# Patient Record
Sex: Female | Born: 1980
Health system: Southern US, Community
[De-identification: ages and names within clinical notes are randomized; demographics above are authoritative.]

---
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
## 2001-08-29 ENCOUNTER — Ambulatory Visit (HOSPITAL_COMMUNITY): Admission: RE | Admit: 2001-08-29 | Discharge: 2001-08-29 | Payer: Self-pay | Admitting: Obstetrics and Gynecology

## 2001-08-29 ENCOUNTER — Encounter: Payer: Self-pay | Admitting: Obstetrics and Gynecology

## 2004-04-19 ENCOUNTER — Emergency Department (HOSPITAL_COMMUNITY): Admission: EM | Admit: 2004-04-19 | Discharge: 2004-04-19 | Payer: Self-pay | Admitting: Emergency Medicine

## 2004-06-17 ENCOUNTER — Encounter: Admission: RE | Admit: 2004-06-17 | Discharge: 2004-06-17 | Payer: Self-pay | Admitting: Neurology

## 2004-06-22 ENCOUNTER — Encounter: Admission: RE | Admit: 2004-06-22 | Discharge: 2004-06-22 | Payer: Self-pay | Admitting: Neurology

## 2004-08-19 ENCOUNTER — Encounter: Admission: RE | Admit: 2004-08-19 | Discharge: 2004-08-19 | Payer: Self-pay | Admitting: Neurology

## 2004-08-22 ENCOUNTER — Encounter: Admission: RE | Admit: 2004-08-22 | Discharge: 2004-08-22 | Payer: Self-pay | Admitting: Neurology

## 2005-12-28 ENCOUNTER — Inpatient Hospital Stay (HOSPITAL_COMMUNITY): Admission: AD | Admit: 2005-12-28 | Discharge: 2005-12-28 | Payer: Self-pay | Admitting: Obstetrics and Gynecology

## 2006-01-07 ENCOUNTER — Inpatient Hospital Stay (HOSPITAL_COMMUNITY): Admission: AD | Admit: 2006-01-07 | Discharge: 2006-01-07 | Payer: Self-pay | Admitting: Obstetrics and Gynecology

## 2006-01-18 ENCOUNTER — Inpatient Hospital Stay (HOSPITAL_COMMUNITY): Admission: AD | Admit: 2006-01-18 | Discharge: 2006-01-18 | Payer: Self-pay | Admitting: Obstetrics and Gynecology

## 2006-01-19 ENCOUNTER — Inpatient Hospital Stay (HOSPITAL_COMMUNITY): Admission: AD | Admit: 2006-01-19 | Discharge: 2006-01-21 | Payer: Self-pay | Admitting: *Deleted

## 2006-10-27 ENCOUNTER — Emergency Department (HOSPITAL_COMMUNITY): Admission: EM | Admit: 2006-10-27 | Discharge: 2006-10-27 | Payer: Self-pay | Admitting: Family Medicine

## 2007-09-19 IMAGING — US US FETAL BPP W/O NONSTRESS
1 series · 13 of 13 positions shown · non-contrast
Comparison: none

CLINICAL DATA: 40 weeks estimated gestational age with hypertension and headaches for 1 week.  Nonreactive NST.  Assess fetal wellbeing.

[Series 1: us fetal bpp w/o nonstress · 0.49mm/px · 13 of 13 slices shown]
[im 1/13]
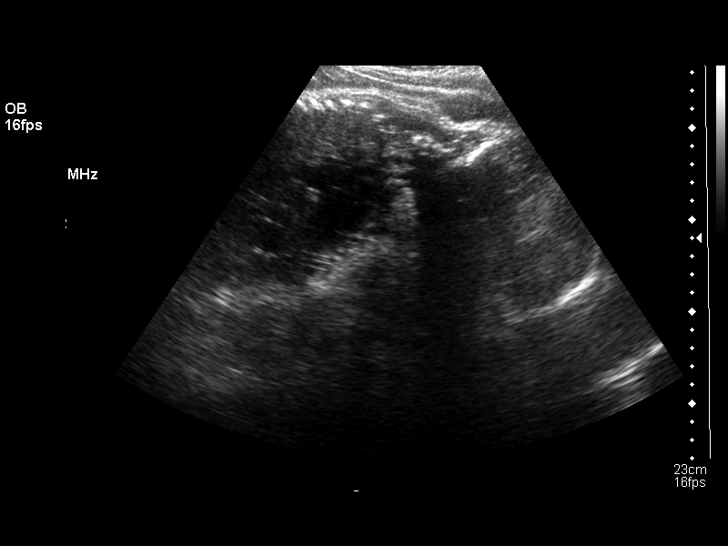
[im 2/13]
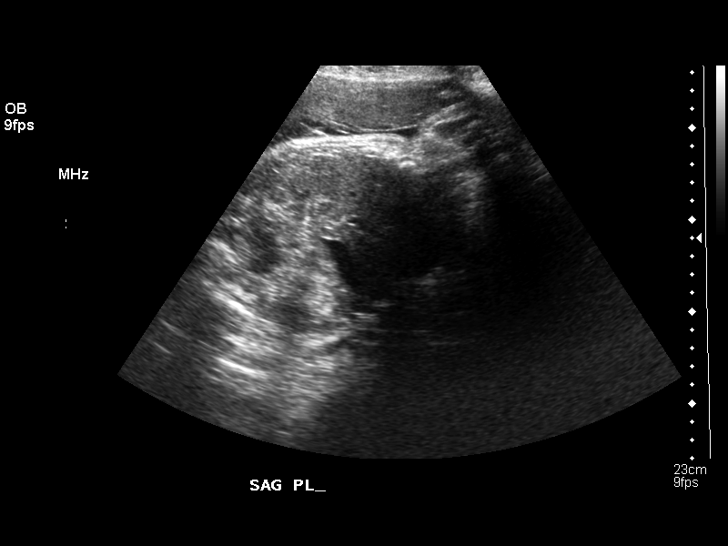
[im 3/13]
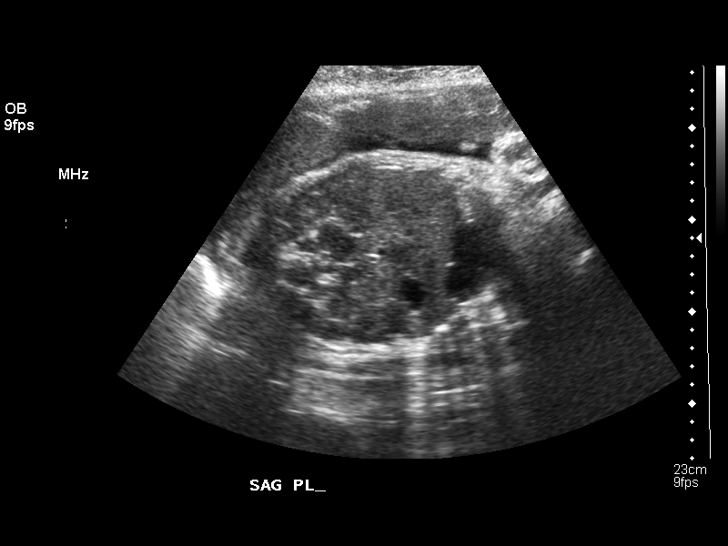
[im 4/13]
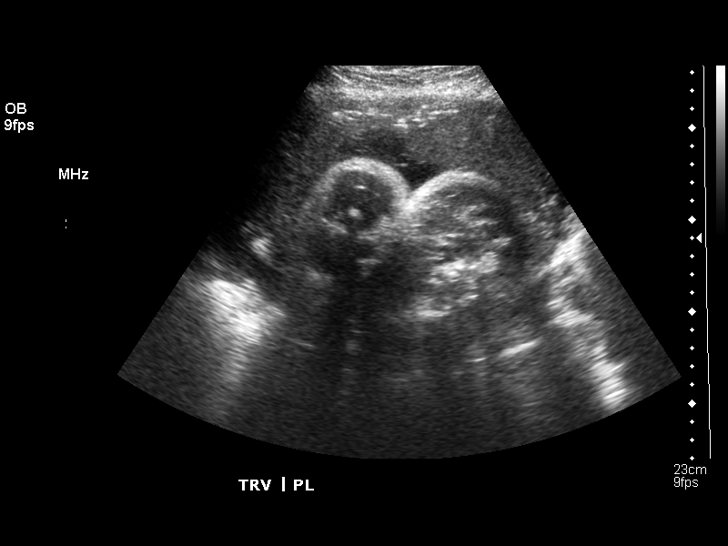
[im 5/13]
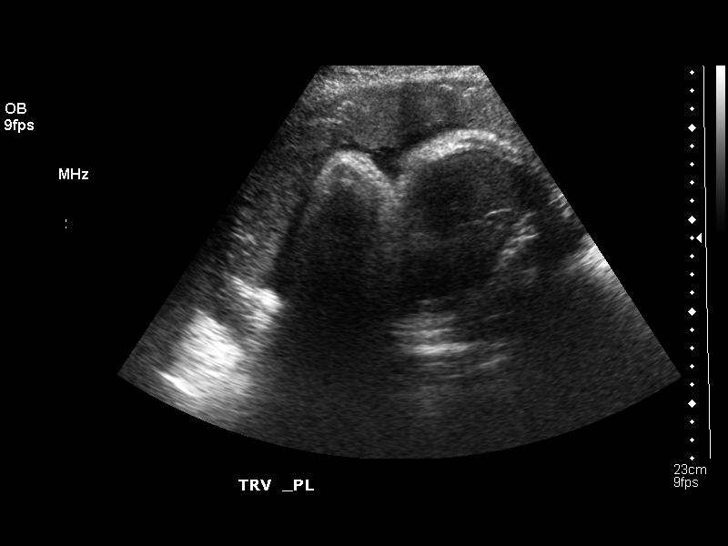
[im 6/13]
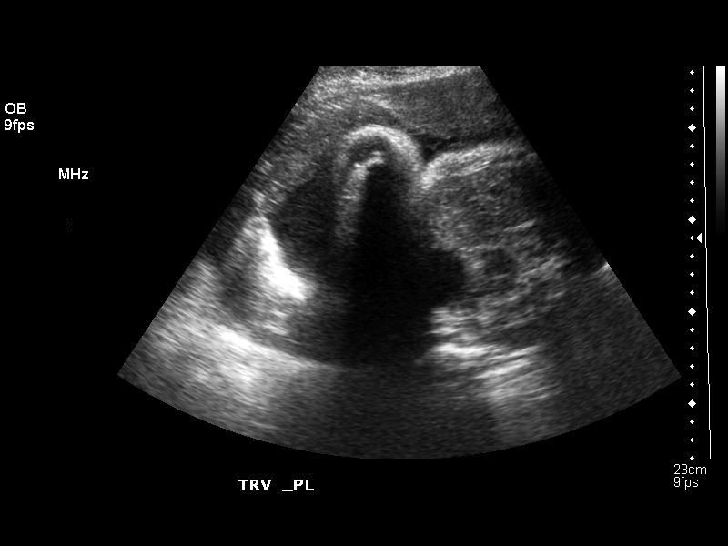
[im 7/13]
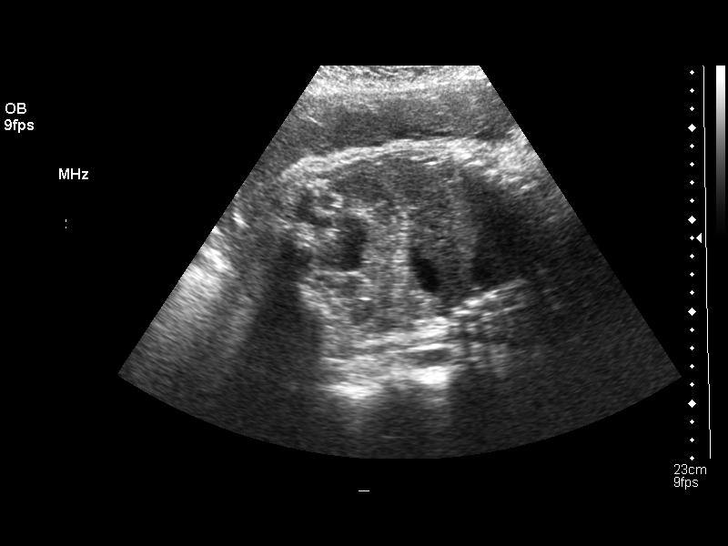
[im 8/13]
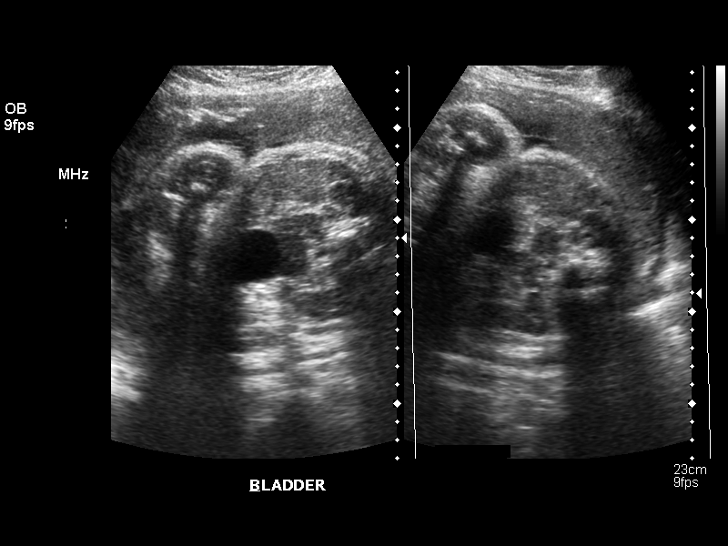
[im 9/13]
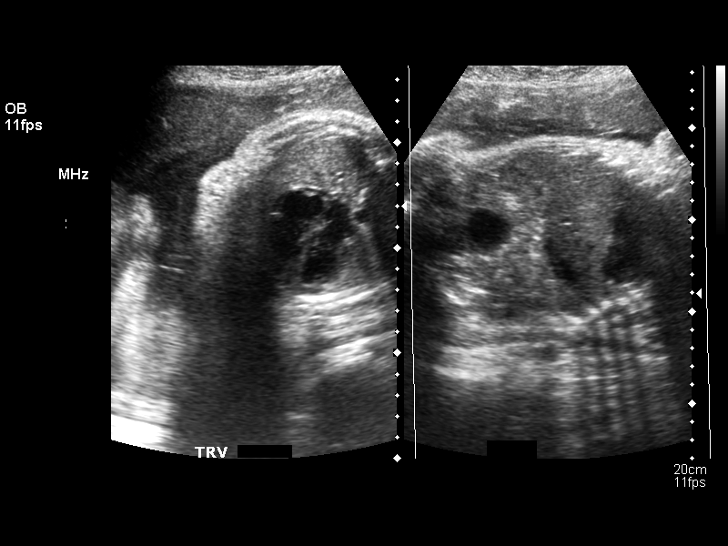
[im 10/13]
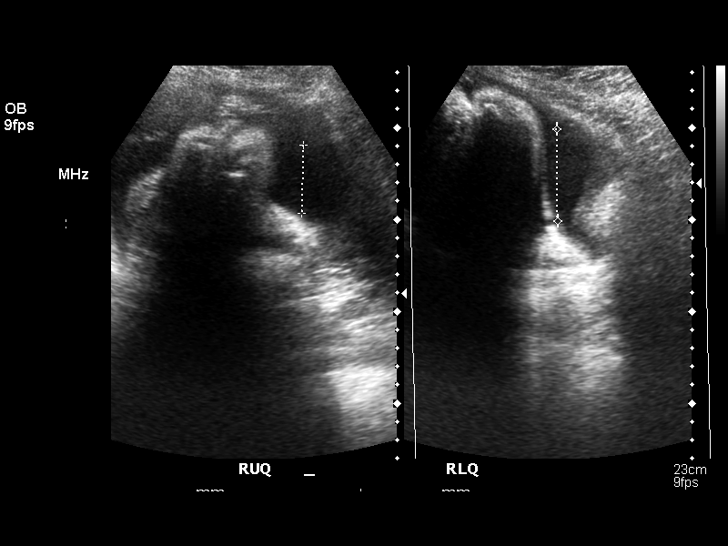
[im 11/13]
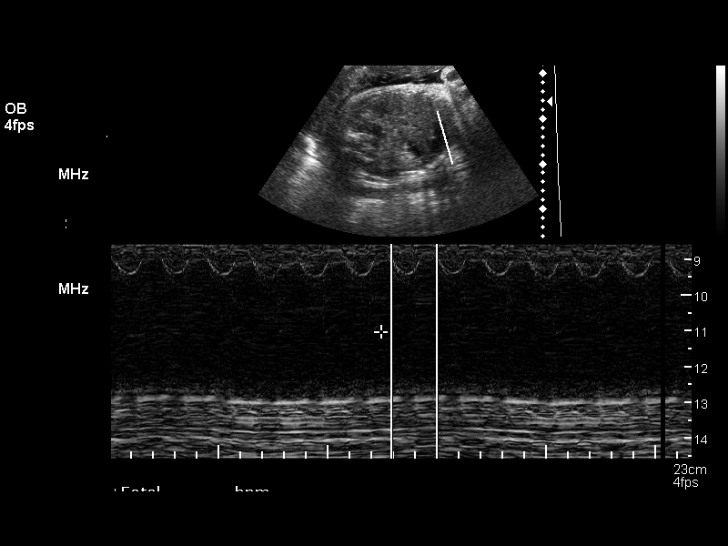
[im 12/13]
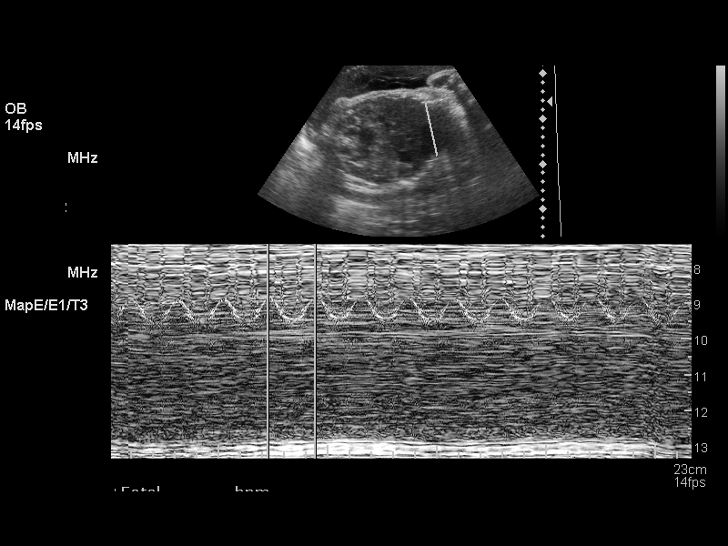
[im 13/13]
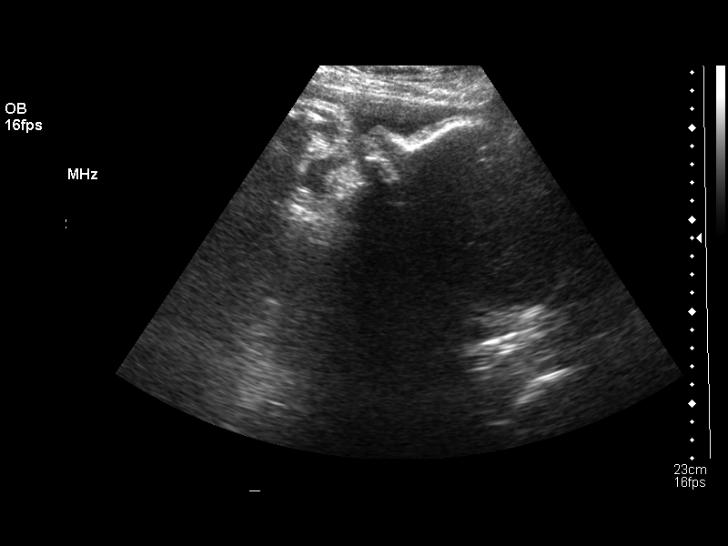

[13 of 13 positions shown; findings below may reference images not displayed]

BIOPHYSICAL PROFILE

 Number of Fetuses:  1
 Heart rate:  147 bpm
 Movement:  Yes
 Breathing:  Yes
 Presentation:  Cephalic
 Placental Location:  Anterior
 Grade:  II
 Previa:  No
 Amniotic Fluid (Subjective):  Normal
 Amniotic Fluid (Objective):  AFI 16.3 cm (5th-85th %ile = 7.1 to 21.4 cm for 40 weeks) 

 Fetal measurements and complete anatomic evaluation were not requested.  The following fetal anatomy was visualized on this exam:   Stomach, kidneys, bladder, and diaphragm.  

 BPP SCORING
 Movements:  2  Time:  30 minutes
 Breathing:  0
 Tone:  2
 Amniotic Fluid:  2
 Total Score:  6

 MATERNAL UTERINE AND ADNEXAL FINDINGS
 Cervix:  Not evaluated.
IMPRESSION: 1.  Biophysical profile score [DATE].  The fetus scored zero for lack of sustained breathing for 20 seconds or more, although fetal breathing was visualized.  
 2.  Subjectively and quantitatively normal amniotic fluid volume.  
 3.  No late developing fetal anatomic abnormalities are seen associated with the stomach, kidneys, or bladder.  A 4-chamber heart view and lateral ventricles could not be seen with clarity due to positioning on today?s exam.

## 2008-06-27 IMAGING — CR DG ANKLE COMPLETE 3+V*R*
3 series · 3 of 3 positions shown · non-contrast
Comparison: none

CLINICAL DATA: Injury.
 RIGHT ANKLE ? 3 VIEW:

[view not recorded (1 of 3)]
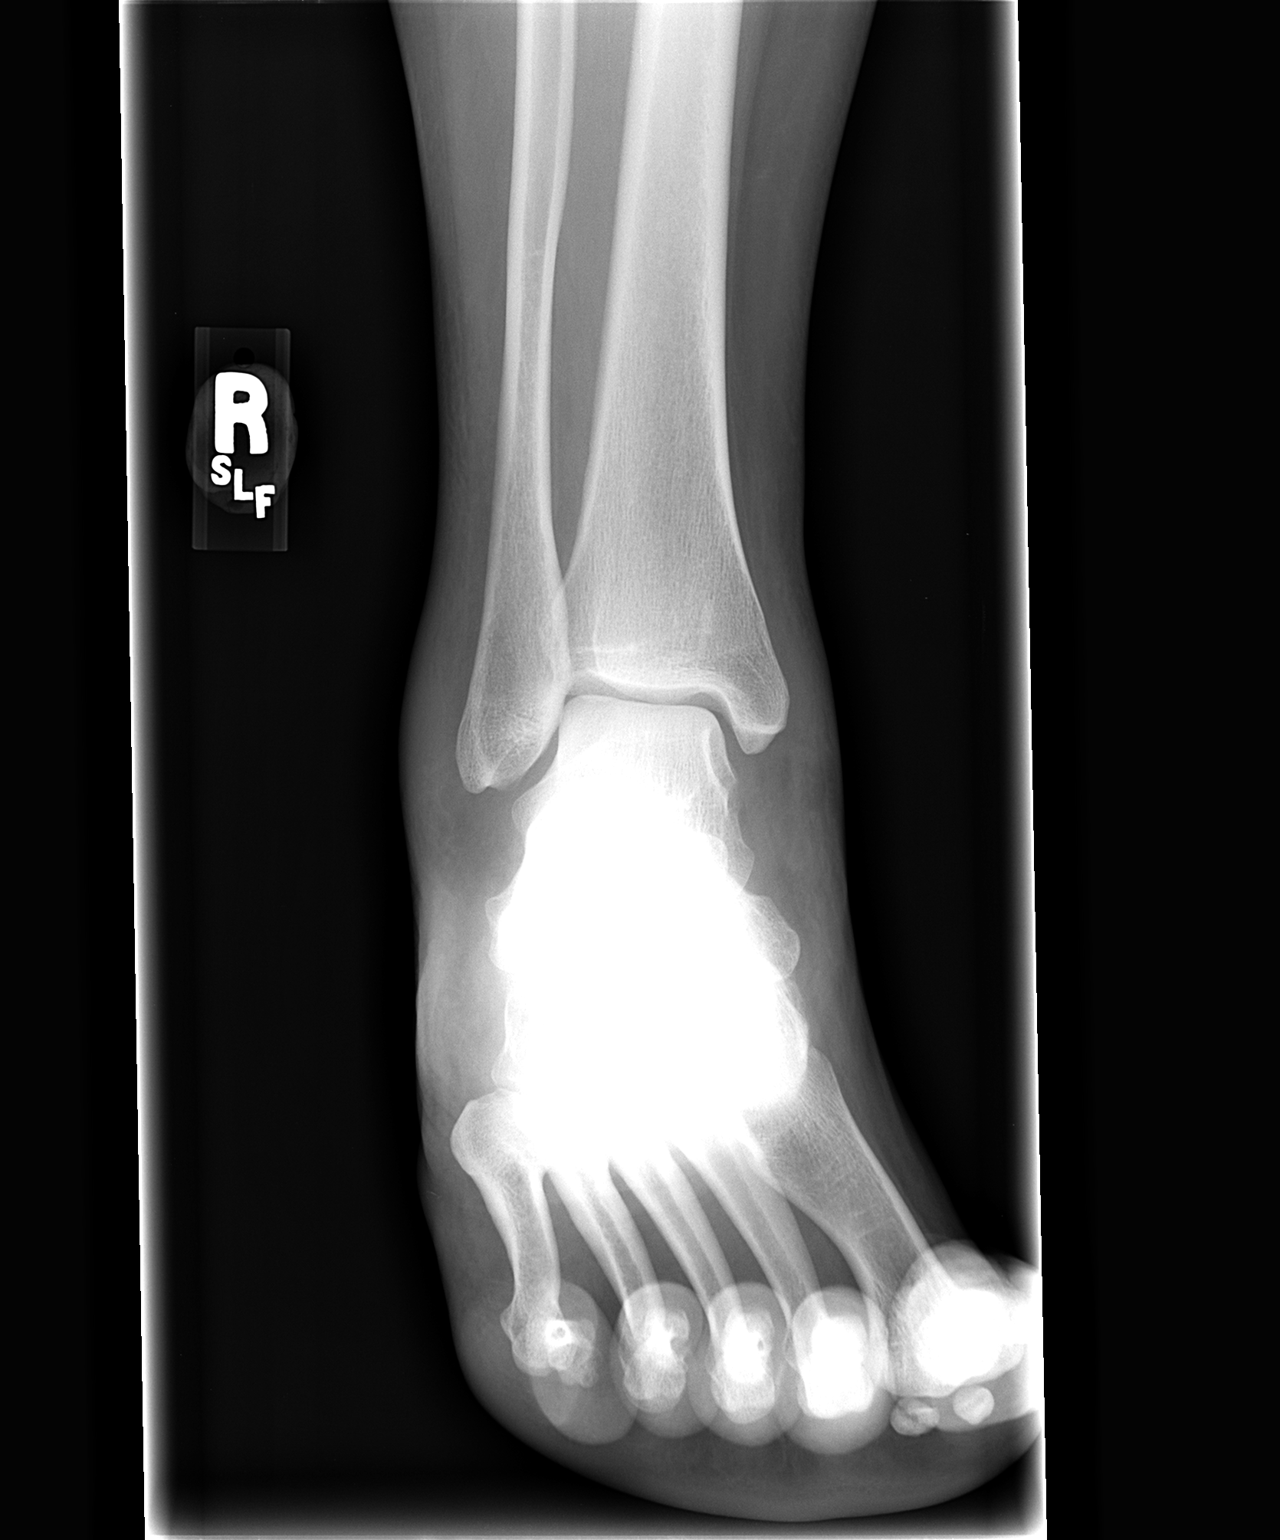

[view not recorded (2 of 3)]
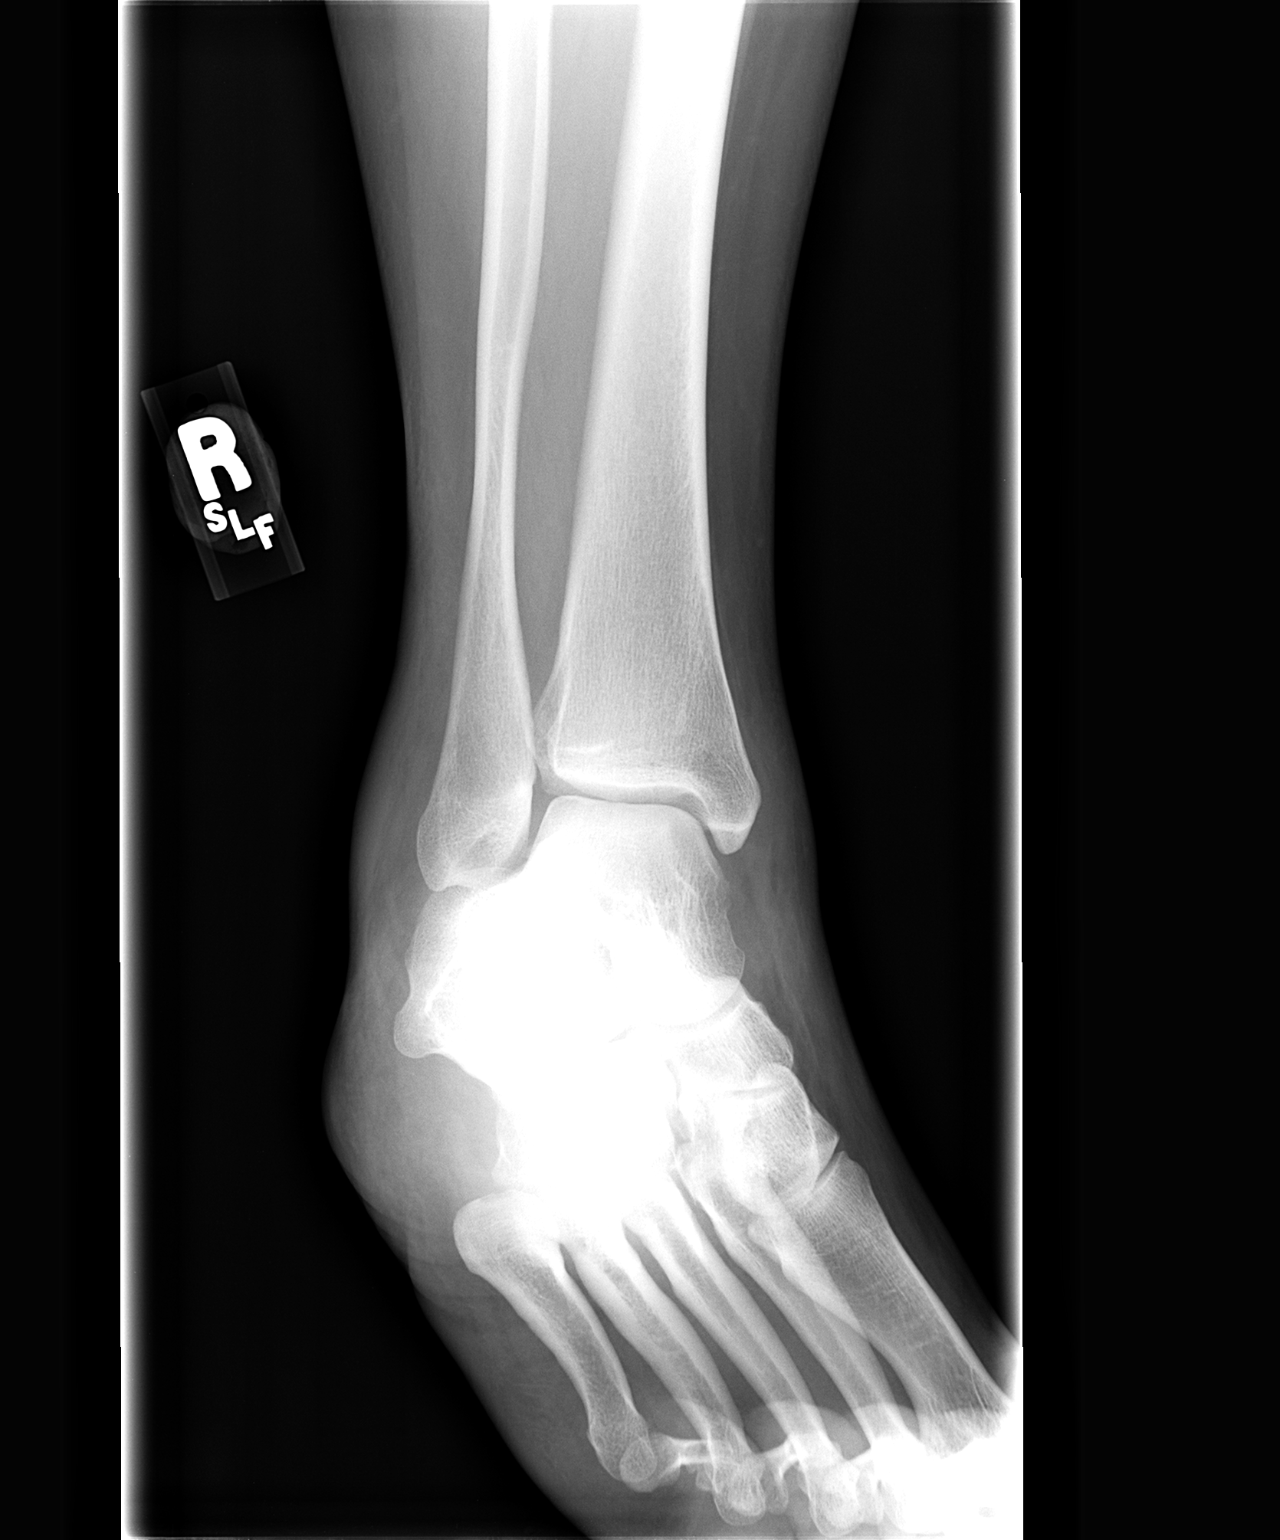

[view not recorded (3 of 3)]
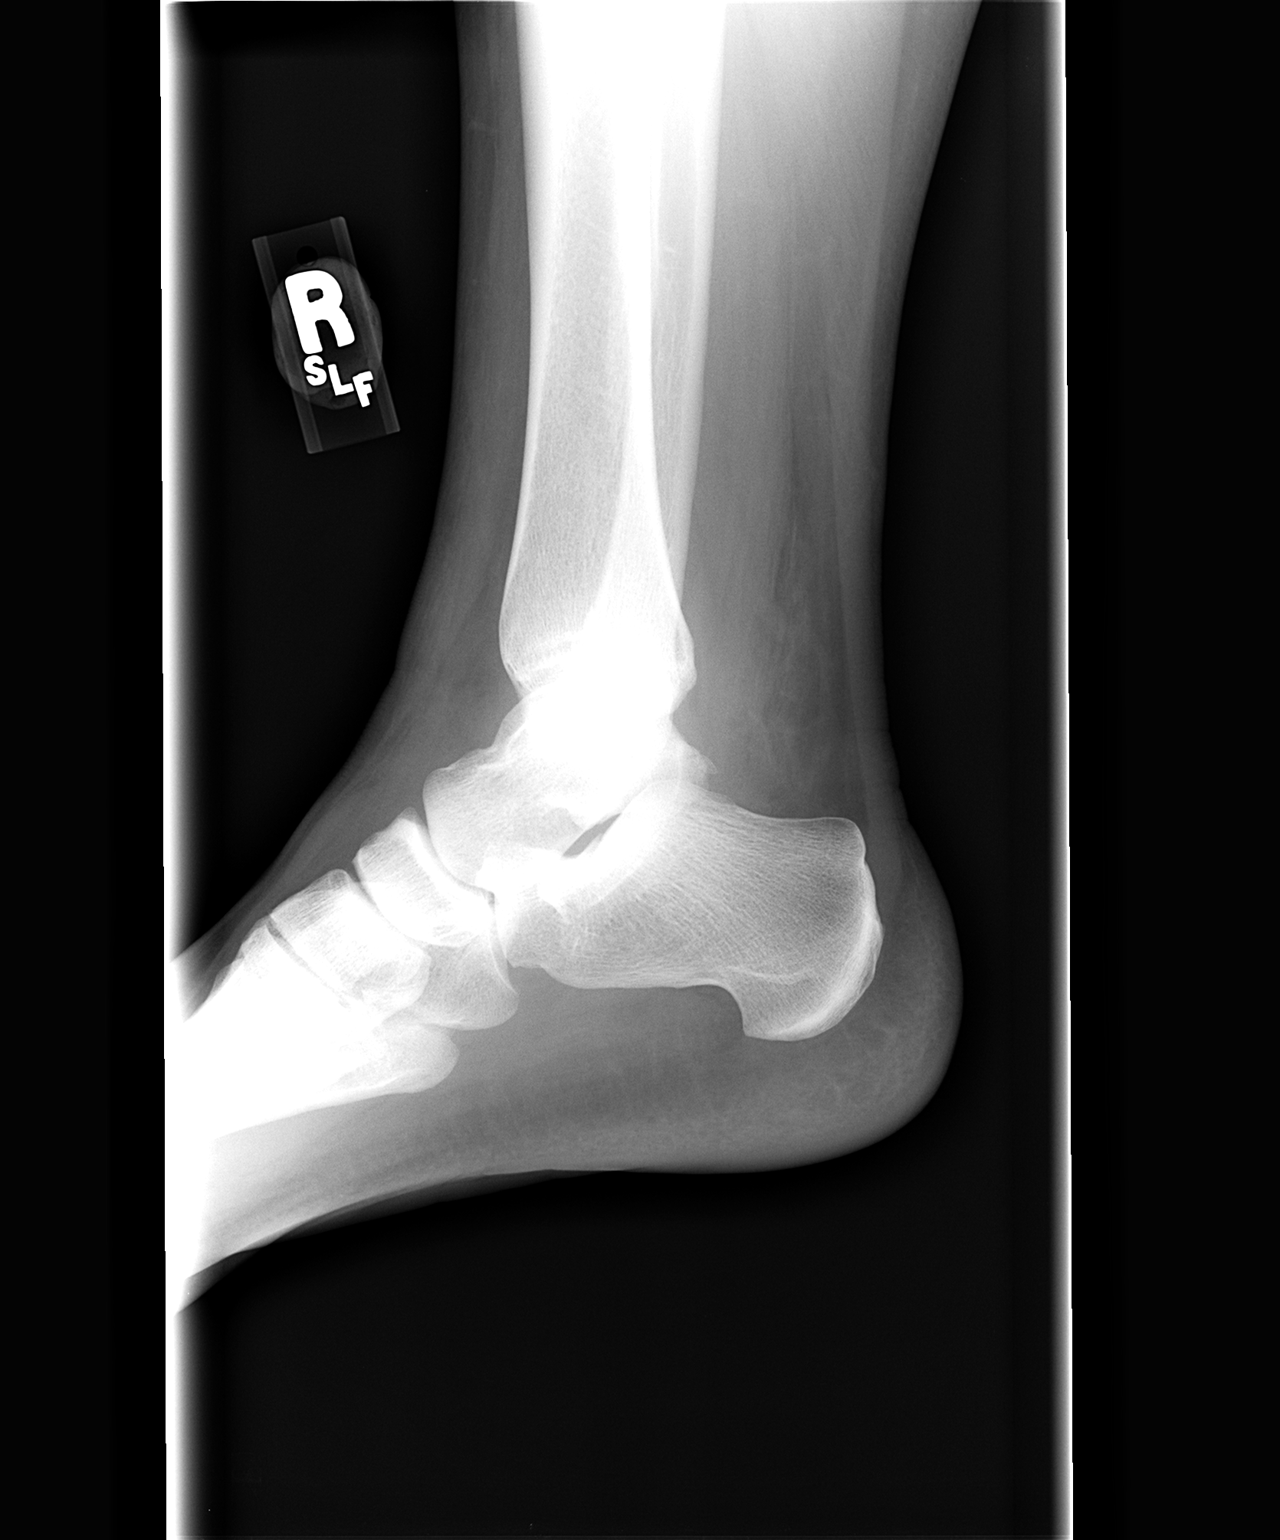

[3 of 3 positions shown; findings below may reference images not displayed]

FINDINGS: Soft tissue swelling about the ankle joint is noted.  There is no underlying fracture or dislocation seen.
IMPRESSION: Soft tissue swelling.  No acute bony injury.

## 2009-07-18 ENCOUNTER — Inpatient Hospital Stay (HOSPITAL_COMMUNITY): Admission: AD | Admit: 2009-07-18 | Discharge: 2009-07-18 | Payer: Self-pay | Admitting: Obstetrics and Gynecology

## 2009-07-18 ENCOUNTER — Ambulatory Visit: Payer: Self-pay | Admitting: Advanced Practice Midwife

## 2009-07-26 ENCOUNTER — Inpatient Hospital Stay (HOSPITAL_COMMUNITY): Admission: AD | Admit: 2009-07-26 | Discharge: 2009-07-28 | Payer: Self-pay | Admitting: Obstetrics and Gynecology

## 2010-06-07 LAB — CBC
HCT: 34.3 % — ABNORMAL LOW (ref 36.0–46.0)
Hemoglobin: 10.4 g/dL — ABNORMAL LOW (ref 12.0–15.0)
MCHC: 34.5 g/dL (ref 30.0–36.0)
Platelets: 202 10*3/uL (ref 150–400)
RBC: 3.99 MIL/uL (ref 3.87–5.11)
RDW: 16.1 % — ABNORMAL HIGH (ref 11.5–15.5)
WBC: 16.5 10*3/uL — ABNORMAL HIGH (ref 4.0–10.5)

## 2010-06-07 LAB — URINE MICROSCOPIC-ADD ON

## 2010-06-07 LAB — URINALYSIS, ROUTINE W REFLEX MICROSCOPIC
Bilirubin Urine: NEGATIVE
Hgb urine dipstick: NEGATIVE
Nitrite: POSITIVE — AB
Protein, ur: NEGATIVE mg/dL
Specific Gravity, Urine: >1.03 — ABNORMAL HIGH (ref 1.005–1.030)
pH: 6 (ref 5.0–8.0)

## 2010-06-07 LAB — URINE CULTURE: Colony Count: >=100000

## 2010-08-05 NOTE — Consult Note (Signed)
NAMELESSA, HUGE NO.:  [id number]   MEDICAL RECORD NO.:  [id number]          PATIENT TYPE:  [facility]   LOCATION:  [facility]                          FACILITY:  [facility]   PHYSICIAN:  Lenoard Aden, M.D.DATE OF BIRTH:  Jun 14, 1980   DATE OF CONSULTATION:  01/07/2006  DATE OF DISCHARGE:                                   CONSULTATION   CHIEF COMPLAINT:  Rule out labor.   HISTORY OF PRESENT ILLNESS:  This is a 30 year old white female, G2, P0,  currently at [redacted] weeks gestation, who presents with complaint of  contractions. She denies bleeding or leakage of fluid.   ALLERGIES:  NO KNOWN DRUG ALLERGIES.   FAMILY HISTORY:  History of myocardial infarction, heart disease.   OB-GYNECOLOGIC HISTORY:  Pregnancy course has been relatively uncomplicated.  She has a history of SAB with no D&C in 2005.   PAST MEDICAL HISTORY:  She has a history of multiple sclerosis in remission,  scoliosis, infertility. She has previously had chicken pox.   SOCIAL HISTORY:  She is a previous smoker, currently reformed.   PHYSICAL EXAMINATION:  VITAL SIGNS:  Stable. Afebrile.  HEENT:  Normal.  NECK:  Supple. Full range of motion.  LUNGS:  Clear.  HEART:  Regular rate and rhythm.  ABDOMEN:  Soft, gravid, and non-tender.  PELVIC:  Cervix 2 cm, 50% vertex, minus 2.  EXTREMITIES:  No cords.  NEUROLOGIC:  Non-focal examination.  SKIN:  Intact.   EMERGENCY DEPARTMENT COURSE:  NST is reactive, irregular. No contractions  are noted. The patient is 38-weeks OB, prodromal labor pattern.   PLAN:  Discharge home. Reassurance given. Fetal activity discussed. Labor  warnings given.     Lenoard Aden, M.D.  Electronically Signed    RJT/MEDQ  D:  01/07/2006  T:  01/07/2006  Job:  [id number]

## 2019-11-14 ENCOUNTER — Ambulatory Visit: Payer: Self-pay | Attending: Internal Medicine

## 2019-11-14 DIAGNOSIS — Z23 Encounter for immunization: Secondary | ICD-10-CM

## 2019-11-14 NOTE — Progress Notes (Signed)
   Covid-19 Vaccination Clinic  Name:  Deanna Olsen    MRN: [id number] DOB: [birth year]  11/14/2019  Ms. Deanna Olsen was observed post Covid-19 immunization for 15 minutes without incident. She was provided with Vaccine Information Sheet and instruction to access the V-Safe system.   Ms. Deanna Olsen was instructed to call 911 with any severe reactions post vaccine: Marland Kitchen Difficulty breathing  . Swelling of face and throat  . A fast heartbeat  . A bad rash all over body  . Dizziness and weakness   Immunizations Administered    Name Date Dose VIS Date Route   JANSSEN COVID-19 VACCINE 11/14/2019 11:52 AM 0.5 mL 05/17/2019 Intramuscular   Manufacturer: [employer]   Lot: [id number]   NDC: 14431-540-08

## 2019-12-25 ENCOUNTER — Other Ambulatory Visit: Payer: Self-pay

## 2019-12-25 ENCOUNTER — Ambulatory Visit
Admission: RE | Admit: 2019-12-25 | Discharge: 2019-12-25 | Disposition: A | Discharge status: Home / Self Care | Payer: Self-pay | Source: Ambulatory Visit | Attending: Emergency Medicine | Admitting: Emergency Medicine

## 2019-12-25 VITALS — BP 108/78 | HR 94 | Temp 97.9°F | Resp 16

## 2019-12-25 DIAGNOSIS — R300 Dysuria: Secondary | ICD-10-CM | POA: Insufficient documentation

## 2019-12-25 DIAGNOSIS — M546 Pain in thoracic spine: Secondary | ICD-10-CM | POA: Insufficient documentation

## 2019-12-25 LAB — POCT URINALYSIS DIP (MANUAL ENTRY)
Bilirubin, UA: NEGATIVE
Glucose, UA: NEGATIVE mg/dL
Ketones, POC UA: NEGATIVE mg/dL
Leukocytes, UA: NEGATIVE
Nitrite, UA: POSITIVE — AB
Protein Ur, POC: NEGATIVE mg/dL
Spec Grav, UA: 1.025 (ref 1.010–1.025)
Urobilinogen, UA: 0.2 E.U./dL
pH, UA: 6 (ref 5.0–8.0)

## 2019-12-25 LAB — POCT URINE PREGNANCY: Preg Test, Ur: NEGATIVE

## 2019-12-25 MED ORDER — NAPROXEN 500 MG PO TABS: 500.0000 mg | ORAL_TABLET | Freq: Two times a day (BID) | ORAL | 0 refills | Status: DC

## 2019-12-25 MED ORDER — CEPHALEXIN 500 MG PO CAPS: 500.0000 mg | ORAL_CAPSULE | Freq: Two times a day (BID) | ORAL | 0 refills | Status: AC

## 2019-12-25 MED ORDER — TIZANIDINE HCL 4 MG PO TABS: 4.0000 mg | ORAL_TABLET | Freq: Four times a day (QID) | ORAL | 0 refills | Status: DC | PRN

## 2019-12-25 NOTE — ED Provider Notes (Signed)
[facility]    CSN: [id number] Arrival date & time: 12/25/19  1054      History   Chief Complaint Chief Complaint  Patient presents with  . Back Pain    middle back radiating around under her breast    HPI Deanna Olsen is a 39 y.o. female presenting today for evaluation of back pain.  Patient reports that since Saturday she has had back pain throughout her bilateral lower ribs wrapping under bilateral breasts.  Denies any injury fall or increase in heavy lifting.  Pain is constant, worse with movement.  Denies any cough shortness of breath.  Denies fevers.  Patient does report tobacco use and reports approximate 1 pack/day.  Denies any significant associated abdominal pain, has had prior cholecystectomy.  Has had decreased appetite due to pain.  She does report some irritation in frequency with urination, but denies any vaginal symptoms of discharge irritation.  Using Tylenol, icy hot and heating pad without significant improvement.  Last menstrual cycle 9/25.  Is not on birth control.   HPI  History reviewed. No pertinent past medical history.  There are no problems to display for this patient.   History reviewed. No pertinent surgical history.  OB History   No obstetric history on file.      Home Medications    Prior to Admission medications   Medication Sig Start Date End Date Taking? Authorizing Provider  cephALEXin (KEFLEX) 500 MG capsule Take 1 capsule (500 mg total) by mouth 2 (two) times daily for 7 days. 12/25/19 01/01/20  Aribelle Mccosh C, PA-C  naproxen (NAPROSYN) 500 MG tablet Take 1 tablet (500 mg total) by mouth 2 (two) times daily. 12/25/19   Adaysha Dubinsky C, PA-C  tiZANidine (ZANAFLEX) 4 MG tablet Take 1 tablet (4 mg total) by mouth every 6 (six) hours as needed for muscle spasms. 12/25/19   Courtlynn Holloman, Junius Creamer, PA-C    Family History History reviewed. No pertinent family history.  Social History Social History   Tobacco Use  . Smoking  status: Never Smoker  . Smokeless tobacco: Never Used  Vaping Use  . Vaping Use: Never used  Substance Use Topics  . Alcohol use: Not Currently  . Drug use: Never     Allergies   Patient has no known allergies.   Review of Systems Review of Systems  Constitutional: Negative for fever.  Respiratory: Negative for shortness of breath.   Cardiovascular: Positive for chest pain.  Gastrointestinal: Negative for abdominal pain, diarrhea, nausea and vomiting.  Genitourinary: Positive for dysuria and frequency. Negative for flank pain, genital sores, hematuria, menstrual problem, vaginal bleeding, vaginal discharge and vaginal pain.  Musculoskeletal: Positive for back pain and myalgias.  Skin: Negative for rash.  Neurological: Negative for dizziness, light-headedness and headaches.     Physical Exam Triage Vital Signs ED Triage Vitals  Enc Vitals Group     BP      Pulse      Resp      Temp      Temp src      SpO2      Weight      Height      Head Circumference      Peak Flow      Pain Score      Pain Loc      Pain Edu?      Excl. in GC?    No data found.  Updated Vital Signs BP 108/78 (BP Location: Left Arm)  Pulse 94   Temp 97.9 F (36.6 C) (Oral)   Resp 16   LMP 12/13/2019 (Approximate)   SpO2 96%   Visual Acuity Right Eye Distance:   Left Eye Distance:   Bilateral Distance:    Right Eye Near:   Left Eye Near:    Bilateral Near:     Physical Exam Vitals and nursing note reviewed.  Constitutional:      Appearance: She is well-developed.     Comments: No acute distress  HENT:     Head: Normocephalic and atraumatic.     Nose: Nose normal.  Eyes:     Conjunctiva/sclera: Conjunctivae normal.  Cardiovascular:     Rate and Rhythm: Normal rate.  Pulmonary:     Effort: Pulmonary effort is normal. No respiratory distress.     Comments: Breathing comfortably at rest, CTABL, no wheezing, rales or other adventitious sounds auscultated Abdominal:      General: There is no distension.     Comments: Mild tenderness to epigastrium and mid lower abdomen, negative rebound, negative Rovsing negative Burney's  Musculoskeletal:        General: Normal range of motion.     Cervical back: Neck supple.     Comments: Lower thoracic spine tender to palpation midline and diffusely throughout bilateral lower thoracic musculature wrapping to flank and anterior lower rib cage underneath bilateral breast  No rash noted  Skin:    General: Skin is warm and dry.  Neurological:     Mental Status: She is alert and oriented to person, place, and time.      UC Treatments / Results  Labs (all labs ordered are listed, but only abnormal results are displayed) Labs Reviewed  POCT URINALYSIS DIP (MANUAL ENTRY) - Abnormal; Notable for the following components:      Result Value   Blood, UA small (*)    Nitrite, UA Positive (*)    All other components within normal limits  URINE CULTURE  POCT URINE PREGNANCY    EKG   Radiology No results found.  Procedures Procedures (including critical care time)  Medications Ordered in UC Medications - No data to display  Initial Impression / Assessment and Plan / UC Course  I have reviewed the triage vital signs and the nursing notes.  Pertinent labs & imaging results that were available during my care of the patient were reviewed by me and considered in my medical decision making (see chart for details).     UA with positive nitrite, sending for culture.  Empirically treating for UTI with Keflex twice daily x1 week.  Push fluids.  Feels back pain most likely correlated with underlying UTI given location in thoracic area.  Treating for MSK etiology, lungs clear, no recent URI symptoms or cough, low suspicion of pneumonia.  Sending home with Naprosyn and muscle relaxers, discussed activity modification. Discussed strict return precautions. Patient verbalized understanding and is agreeable with plan.   Final  Clinical Impressions(s) / UC Diagnoses   Final diagnoses:  Acute bilateral thoracic back pain  Dysuria     Discharge Instructions     Urine culture pending Keflex twice daily for 1 week for UTI Drink plenty of fluids  Naprosyn twice daily with food for back pain Tizanidine as needed to supplement which is a muscle relaxer  Follow up if not improving or worsening    ED Prescriptions    Medication Sig Dispense Auth. Provider   cephALEXin (KEFLEX) 500 MG capsule Take 1 capsule (500 mg  total) by mouth 2 (two) times daily for 7 days. 14 capsule Charnee Turnipseed C, PA-C   naproxen (NAPROSYN) 500 MG tablet Take 1 tablet (500 mg total) by mouth 2 (two) times daily. 30 tablet Paeton Latouche C, PA-C   tiZANidine (ZANAFLEX) 4 MG tablet Take 1 tablet (4 mg total) by mouth every 6 (six) hours as needed for muscle spasms. 30 tablet Leo Weyandt, [place] C, PA-C     PDMP not reviewed this encounter.   Sharita Bienaime, [place] C, PA-C 12/25/19 1204

## 2019-12-25 NOTE — Discharge Instructions (Signed)
Urine culture pending Keflex twice daily for 1 week for UTI Drink plenty of fluids  Naprosyn twice daily with food for back pain Tizanidine as needed to supplement which is a muscle relaxer  Follow up if not improving or worsening

## 2019-12-25 NOTE — ED Triage Notes (Signed)
Pt states she has ben experiencing middle back pain since Monday. Pt states it radiates around under her ribcage bilaterally. Pt states discomfort when urinating. Pt is aox4 and ambulatory.

## 2019-12-27 LAB — URINE CULTURE: Culture: >=100000 — AB

## 2020-03-19 ENCOUNTER — Ambulatory Visit
Admission: EM | Admit: 2020-03-19 | Discharge: 2020-03-19 | Disposition: A | Discharge status: Home / Self Care | Payer: Self-pay | Attending: Internal Medicine | Admitting: Internal Medicine

## 2020-03-19 DIAGNOSIS — J069 Acute upper respiratory infection, unspecified: Secondary | ICD-10-CM

## 2020-03-19 DIAGNOSIS — H6692 Otitis media, unspecified, left ear: Secondary | ICD-10-CM

## 2020-03-19 MED ORDER — AMOXICILLIN 875 MG PO TABS: 875.0000 mg | ORAL_TABLET | Freq: Two times a day (BID) | ORAL | 0 refills | Status: DC

## 2020-03-19 MED ORDER — PSEUDOEPHEDRINE HCL ER 120 MG PO TB12: 120.0000 mg | ORAL_TABLET | Freq: Two times a day (BID) | ORAL | 0 refills | Status: DC

## 2020-03-19 NOTE — ED Provider Notes (Signed)
[facility]    CSN: [id number] Arrival date & time: 03/19/20  0900      History   Chief Complaint Chief Complaint  Patient presents with  . Otalgia    HPI Deanna Olsen Clock is a 39 y.o. female who woke up with nose congestion yesterday am and took some OTC sudafed. As the pm went on her L ear felt fooler and in the middle of the night woke up with L ear pain and felt it was draining. When she cleansed it with Qtip she saw blood. Denies Hx of OM, fever, chills, cough more than her usual smokers cough, or GI symptoms     No past medical history on file.  There are no problems to display for this patient.   No past surgical history on file.  OB History   No obstetric history on file.      Home Medications    Prior to Admission medications   Medication Sig Start Date End Date Taking? Authorizing Provider  naproxen (NAPROSYN) 500 MG tablet Take 1 tablet (500 mg total) by mouth 2 (two) times daily. 12/25/19   Wieters, Hallie C, PA-C  tiZANidine (ZANAFLEX) 4 MG tablet Take 1 tablet (4 mg total) by mouth every 6 (six) hours as needed for muscle spasms. 12/25/19   Wieters, Junius Creamer, PA-C    Family History No family history on file.  Social History Social History   Tobacco Use  . Smoking status: Never Smoker  . Smokeless tobacco: Never Used  Vaping Use  . Vaping Use: Never used  Substance Use Topics  . Alcohol use: Not Currently  . Drug use: Never     Allergies   Patient has no known allergies.   Review of Systems Review of Systems  Constitutional: Negative for chills and fever.  HENT: Positive for ear discharge and ear pain.   Respiratory: Negative for cough.   Gastrointestinal: Negative for diarrhea, nausea and vomiting.  Musculoskeletal: Negative for myalgias.  Neurological: Positive for headaches.     Physical Exam Triage Vital Signs ED Triage Vitals  Enc Vitals Group     BP 03/19/20 1049 114/85     Pulse Rate 03/19/20 1049 80     Resp  03/19/20 1049 16     Temp 03/19/20 1049 (!) 97.4 F (36.3 C)     Temp Source 03/19/20 1049 Temporal     SpO2 03/19/20 1049 96 %     Weight 03/19/20 0958 154 lb (69.9 kg)     Height --      Head Circumference --      Peak Flow --      Pain Score 03/19/20 0958 3     Pain Loc --      Pain Edu? --      Excl. in GC? --    No data found.  Updated Vital Signs BP 114/85 (BP Location: Left Arm)   Pulse 80   Temp (!) 97.4 F (36.3 C) (Temporal)   Resp 16   Wt 154 lb (69.9 kg)   LMP  (Within Weeks) Comment: 3 weeks   SpO2 96%   Visual Acuity Right Eye Distance:   Left Eye Distance:   Bilateral Distance:    Right Eye Near:   Left Eye Near:    Bilateral Near:     Physical Exam Vitals and nursing note reviewed.  Constitutional:      General: She is not in acute distress.    Appearance: She is  not toxic-appearing.  HENT:     Right Ear: External ear normal.     Left Ear: External ear normal.     Ears:     Comments: L TM bulging with vessels very prominent and no perforation noted. There is a little blood in the canal    Nose: Nose normal.     Mouth/Throat:     Pharynx: Oropharynx is clear.  Eyes:     General: No scleral icterus.    Conjunctiva/sclera: Conjunctivae normal.  Pulmonary:     Effort: Pulmonary effort is normal.  Musculoskeletal:     Cervical back: Neck supple.  Skin:    General: Skin is warm and dry.  Neurological:     Mental Status: She is alert and oriented to person, place, and time.     Gait: Gait normal.  Psychiatric:        Mood and Affect: Mood normal.        Behavior: Behavior normal.        Thought Content: Thought content normal.        Judgment: Judgment normal.      UC Treatments / Results  Labs (all labs ordered are listed, but only abnormal results are displayed) Labs Reviewed - No data to display  EKG   Radiology No results found.  Procedures Procedures (including critical care time)  Medications Ordered in UC Medications  - No data to display  Initial Impression / Assessment and Plan / UC Course  I have reviewed the triage vital signs and the nursing notes. Has Uri with L OM. I placed her on Sudafed 12h, and Amoxicillin as noted.  Final Clinical Impressions(s) / UC Diagnoses   Final diagnoses:  None   Discharge Instructions   None    ED Prescriptions    None     PDMP not reviewed this encounter.   Garey Ham, [place] 03/20/20 2339

## 2020-03-19 NOTE — ED Triage Notes (Signed)
Patient presents to Urgent Care with complaints of headache, left ear pain and with scant blood tinged drainage since yesterday. Treating with sudafed with no relief.   Denies fever.

## 2021-03-16 ENCOUNTER — Ambulatory Visit
Admission: EM | Admit: 2021-03-16 | Discharge: 2021-03-16 | Disposition: A | Discharge status: Home / Self Care | Payer: Self-pay | Attending: Physician Assistant | Admitting: Physician Assistant

## 2021-03-16 ENCOUNTER — Other Ambulatory Visit: Payer: Self-pay

## 2021-03-16 ENCOUNTER — Encounter: Payer: Self-pay | Admitting: Emergency Medicine

## 2021-03-16 ENCOUNTER — Ambulatory Visit: Admit: 2021-03-16 | Disposition: A | Discharge status: Home / Self Care | Payer: Self-pay

## 2021-03-16 DIAGNOSIS — R1013 Epigastric pain: Secondary | ICD-10-CM

## 2021-03-16 NOTE — ED Triage Notes (Signed)
Epigastric pain that radiated through to her back starting yesterday. This morning her entire abdomin was very tender. Vomited once yesterday, said it initially made her feel better but that the feelings came back. Reports drinking occasional glasses of wine. Denies regular NSAID use. Says she just recently stopped her period but noticed new pink spotting yesterday, denies possibility of pregnancy.

## 2021-03-16 NOTE — ED Provider Notes (Signed)
[facility]    CSN: [id number] Arrival date & time: 03/16/21  1353      History   Chief Complaint No chief complaint on file.   HPI Deanna Olsen is a 40 y.o. female.   Patient here today for evaluation of epigastric pain that started yesterday.  She reports that pain does radiate through her back at times.  She reports that this morning her entire abdomen was tender to touch.  She did vomit once yesterday.  She has not had any further nausea or vomiting.  She denies any diarrhea.  She has not had any blood in her stool or dark tarry stools.  She has not had fever or chills.  She states symptoms have improved somewhat.  The history is provided by the patient.   Past Medical History:  Diagnosis Date   Multiple sclerosis (HCC)     There are no problems to display for this patient.   Past Surgical History:  Procedure Laterality Date   CHOLECYSTECTOMY      OB History   No obstetric history on file.      Home Medications    Prior to Admission medications   Not on File    Family History History reviewed. No pertinent family history.  Social History Social History   Tobacco Use   Smoking status: Every Day    Types: Cigarettes  Vaping Use   Vaping Use: Never used     Allergies   Patient has no known allergies.   Review of Systems Review of Systems  Constitutional:  Negative for chills and fever.  Eyes:  Negative for discharge and redness.  Respiratory:  Negative for shortness of breath.   Gastrointestinal:  Positive for abdominal pain and vomiting. Negative for diarrhea and nausea.    Physical Exam Triage Vital Signs ED Triage Vitals  Enc Vitals Group     BP 03/16/21 1527 128/77     Pulse Rate 03/16/21 1527 95     Resp 03/16/21 1527 16     Temp 03/16/21 1527 98.2 F (36.8 C)     Temp Source 03/16/21 1527 Oral     SpO2 03/16/21 1527 98 %     Weight --      Height --      Head Circumference --      Peak Flow --      Pain  Score 03/16/21 1531 5     Pain Loc --      Pain Edu? --      Excl. in GC? --    No data found.  Updated Vital Signs BP 128/77 (BP Location: Left Arm)    Pulse 95    Temp 98.2 F (36.8 C) (Oral)    Resp 16    SpO2 98%      Physical Exam Vitals and nursing note reviewed.  Constitutional:      General: She is not in acute distress.    Appearance: Normal appearance. She is not ill-appearing.  HENT:     Head: Normocephalic and atraumatic.     Nose: Nose normal.  Cardiovascular:     Rate and Rhythm: Normal rate and regular rhythm.     Heart sounds: Normal heart sounds. No murmur heard. Pulmonary:     Effort: Pulmonary effort is normal. No respiratory distress.     Breath sounds: Normal breath sounds. No wheezing, rhonchi or rales.  Abdominal:     General: Abdomen is flat. Bowel sounds are normal.  There is no distension.     Palpations: Abdomen is soft.     Tenderness: There is abdominal tenderness (mild diffuse TTP, worse to epigastric area). There is no right CVA tenderness, left CVA tenderness or guarding.  Skin:    General: Skin is warm and dry.  Neurological:     Mental Status: She is alert.  Psychiatric:        Mood and Affect: Mood normal.        Thought Content: Thought content normal.     UC Treatments / Results  Labs (all labs ordered are listed, but only abnormal results are displayed) Labs Reviewed  CBC WITH DIFFERENTIAL/PLATELET  COMPREHENSIVE METABOLIC PANEL  AMYLASE  LIPASE    EKG   Radiology No results found.  Procedures Procedures (including critical care time)  Medications Ordered in UC Medications - No data to display  Initial Impression / Assessment and Plan / UC Course  I have reviewed the triage vital signs and the nursing notes.  Pertinent labs & imaging results that were available during my care of the patient were reviewed by me and considered in my medical decision making (see chart for details).    Labs ordered.  Will await  results further recommendation.  Recommended further evaluation in the emergency department if symptoms worsen in any way.  Final Clinical Impressions(s) / UC Diagnoses   Final diagnoses:  Abdominal pain, epigastric   Discharge Instructions   None    ED Prescriptions   None    PDMP not reviewed this encounter.   Tomi Bamberger, PA-C 03/16/21 [phone]

## 2021-03-17 ENCOUNTER — Encounter (HOSPITAL_COMMUNITY): Payer: Self-pay | Admitting: Emergency Medicine

## 2021-03-17 ENCOUNTER — Emergency Department (HOSPITAL_COMMUNITY)
Admission: EM | Admit: 2021-03-17 | Discharge: 2021-03-17 | Disposition: A | Discharge status: Home / Self Care | Payer: Self-pay | Attending: Emergency Medicine | Admitting: Emergency Medicine

## 2021-03-17 ENCOUNTER — Encounter: Payer: Self-pay | Admitting: Emergency Medicine

## 2021-03-17 ENCOUNTER — Other Ambulatory Visit: Payer: Self-pay

## 2021-03-17 ENCOUNTER — Emergency Department (HOSPITAL_COMMUNITY): Payer: Self-pay

## 2021-03-17 DIAGNOSIS — F17210 Nicotine dependence, cigarettes, uncomplicated: Secondary | ICD-10-CM | POA: Insufficient documentation

## 2021-03-17 DIAGNOSIS — K859 Acute pancreatitis without necrosis or infection, unspecified: Secondary | ICD-10-CM | POA: Insufficient documentation

## 2021-03-17 LAB — CBC WITH DIFFERENTIAL/PLATELET
Abs Immature Granulocytes: 0.06 10*3/uL (ref 0.00–0.07)
Basophils Absolute: 0 10*3/uL (ref 0.0–0.2)
Basophils Absolute: 0 10*3/uL (ref 0.0–0.1)
Basophils Relative: 0 %
Basos: 0 %
EOS (ABSOLUTE): 0.1 10*3/uL (ref 0.0–0.4)
Eos: 1 %
Eosinophils Absolute: 0.1 10*3/uL (ref 0.0–0.5)
Eosinophils Relative: 1 %
HCT: 41 % (ref 36.0–46.0)
Hematocrit: 41.8 % (ref 34.0–46.6)
Hemoglobin: 14.4 g/dL (ref 11.1–15.9)
Hemoglobin: 13.9 g/dL (ref 12.0–15.0)
Immature Grans (Abs): 0 10*3/uL (ref 0.0–0.1)
Immature Granulocytes: 0 %
Immature Granulocytes: 1 %
Lymphocytes Absolute: 2.5 10*3/uL (ref 0.7–3.1)
Lymphocytes Relative: 14 %
Lymphs Abs: 1.9 10*3/uL (ref 0.7–4.0)
Lymphs: 25 %
MCH: 30 pg (ref 26.6–33.0)
MCH: 29.8 pg (ref 26.0–34.0)
MCHC: 34.4 g/dL (ref 31.5–35.7)
MCHC: 33.9 g/dL (ref 30.0–36.0)
MCV: 87 fL (ref 79–97)
MCV: 88 fL (ref 80.0–100.0)
Monocytes Absolute: 0.4 10*3/uL (ref 0.1–0.9)
Monocytes Absolute: 0.8 10*3/uL (ref 0.1–1.0)
Monocytes Relative: 6 %
Monocytes: 4 %
Neutro Abs: 10.4 10*3/uL — ABNORMAL HIGH (ref 1.7–7.7)
Neutrophils Absolute: 6.9 10*3/uL (ref 1.4–7.0)
Neutrophils Relative %: 78 %
Neutrophils: 70 %
Platelets: 219 10*3/uL (ref 150–450)
Platelets: 220 10*3/uL (ref 150–400)
RBC: 4.8 x10E6/uL (ref 3.77–5.28)
RBC: 4.66 MIL/uL (ref 3.87–5.11)
RDW: 12.6 % (ref 11.7–15.4)
RDW: 12.7 % (ref 11.5–15.5)
WBC: 10.1 10*3/uL (ref 3.4–10.8)
WBC: 13.2 10*3/uL — ABNORMAL HIGH (ref 4.0–10.5)
nRBC: 0 % (ref 0.0–0.2)

## 2021-03-17 LAB — COMPREHENSIVE METABOLIC PANEL
ALT: 10 IU/L (ref 0–32)
ALT: 10 U/L (ref 0–44)
AST: 12 IU/L (ref 0–40)
AST: 13 U/L — ABNORMAL LOW (ref 15–41)
Albumin/Globulin Ratio: 1.9 (ref 1.2–2.2)
Albumin: 4.3 g/dL (ref 3.8–4.8)
Albumin: 3.8 g/dL (ref 3.5–5.0)
Alkaline Phosphatase: 67 IU/L (ref 44–121)
Alkaline Phosphatase: 57 U/L (ref 38–126)
Anion gap: 7 (ref 5–15)
BUN/Creatinine Ratio: 9 (ref 9–23)
BUN: 7 mg/dL (ref 6–20)
BUN: 7 mg/dL (ref 6–20)
Bilirubin Total: 0.4 mg/dL (ref 0.0–1.2)
CO2: 23 mmol/L (ref 20–29)
CO2: 25 mmol/L (ref 22–32)
Calcium: 9 mg/dL (ref 8.7–10.2)
Calcium: 8.9 mg/dL (ref 8.9–10.3)
Chloride: 102 mmol/L (ref 96–106)
Chloride: 105 mmol/L (ref 98–111)
Creatinine, Ser: 0.74 mg/dL (ref 0.57–1.00)
Creatinine, Ser: 0.71 mg/dL (ref 0.44–1.00)
GFR, Estimated: >60 mL/min (ref >60)
Globulin, Total: 2.3 g/dL (ref 1.5–4.5)
Glucose, Bld: 114 mg/dL — ABNORMAL HIGH (ref 70–99)
Glucose: 82 mg/dL (ref 70–99)
Potassium: 4.2 mmol/L (ref 3.5–5.2)
Potassium: 3.7 mmol/L (ref 3.5–5.1)
Sodium: 137 mmol/L (ref 134–144)
Sodium: 137 mmol/L (ref 135–145)
Total Bilirubin: 0.5 mg/dL (ref 0.3–1.2)
Total Protein: 6.6 g/dL (ref 6.0–8.5)
Total Protein: 6.8 g/dL (ref 6.5–8.1)
eGFR: 105 mL/min/{1.73_m2} (ref >59)

## 2021-03-17 LAB — URINALYSIS, ROUTINE W REFLEX MICROSCOPIC
Bilirubin Urine: NEGATIVE
Glucose, UA: NEGATIVE mg/dL
Ketones, ur: 20 mg/dL — AB
Nitrite: POSITIVE — AB
Protein, ur: 30 mg/dL — AB
Specific Gravity, Urine: 1.028 (ref 1.005–1.030)
WBC, UA: >50 WBC/hpf — ABNORMAL HIGH (ref 0–5)
pH: 5 (ref 5.0–8.0)

## 2021-03-17 LAB — PREGNANCY, URINE: Preg Test, Ur: NEGATIVE

## 2021-03-17 LAB — AMYLASE
Amylase: 689 U/L (ref 31–110)
Amylase: 208 U/L — ABNORMAL HIGH (ref 28–100)

## 2021-03-17 LAB — LIPASE, BLOOD: Lipase: 90 U/L — ABNORMAL HIGH (ref 11–51)

## 2021-03-17 MED ORDER — HYDROMORPHONE HCL 1 MG/ML IJ SOLN
1.0000 mg | Freq: Once | INTRAMUSCULAR | Status: AC
  Administered 2021-03-17: 18:00:00 1 mg via INTRAVENOUS
  Filled 2021-03-17: qty 1

## 2021-03-17 MED ORDER — IOHEXOL 350 MG/ML SOLN
80.0000 mL | Freq: Once | INTRAVENOUS | Status: AC | PRN
  Administered 2021-03-17: 16:00:00 80 mL via INTRAVENOUS

## 2021-03-17 MED ORDER — LACTATED RINGERS IV BOLUS
1000.0000 mL | Freq: Once | INTRAVENOUS | Status: AC
  Administered 2021-03-17: 18:00:00 1000 mL via INTRAVENOUS

## 2021-03-17 MED ORDER — HYDROCODONE-ACETAMINOPHEN 5-325 MG PO TABS
1.0000 | ORAL_TABLET | Freq: Once | ORAL | Status: AC
  Administered 2021-03-17: 22:00:00 1 via ORAL
  Filled 2021-03-17: qty 1

## 2021-03-17 MED ORDER — HYDROCODONE-ACETAMINOPHEN 5-325 MG PO TABS: 1.0000 | ORAL_TABLET | ORAL | 0 refills | Status: DC | PRN

## 2021-03-17 NOTE — ED Notes (Signed)
Patient tolerated oral intake well 

## 2021-03-17 NOTE — ED Triage Notes (Signed)
Pt here POV with c/o abdominal pain. Labs results amylase 664. Pain 9/10 . History of MS

## 2021-03-17 NOTE — Discharge Instructions (Addendum)
Use a liquid diet until you are starting to feel better from an abdominal pain perspective.  Avoid greasy and fatty foods.  Follow-up with the gastroenterology office listed.  If at any point you develop fever, new or worsening or uncontrolled abdominal pain, vomiting, or any other new/concerning symptoms then return to the ER for evaluation.

## 2021-03-17 NOTE — ED Provider Notes (Signed)
[facility] EMERGENCY DEPARTMENT Provider Note   CSN: [id number] Arrival date & time: 03/17/21  1343     History Chief Complaint  Patient presents with   Abdominal Pain    Deanna Olsen is a 40 y.o. female.  HPI 40 year old female presents with upper abdominal pain. Started 2 days ago all of a sudden.  She has never had this before.  At times the pain is severe but currently is about a 6 out of 10.  She took Pepto-Bismol, which urgent care recommended yesterday but it did not help.  She otherwise was called by urgent care and told to go to the ER because they checked an amylase yesterday and it came back elevated at around 600.  The pain is across her upper abdomen and also hurting in her back.  She vomited once and has had a little bit of diarrhea.  She has never had pancreas problems before.  She drinks alcohol occasionally but not significantly her every day.  She had her gallbladder removed previously.  No new medicines.  Past Medical History:  Diagnosis Date   Multiple sclerosis (HCC)     There are no problems to display for this patient.   Past Surgical History:  Procedure Laterality Date   CHOLECYSTECTOMY       OB History   No obstetric history on file.     No family history on file.  Social History   Tobacco Use   Smoking status: Every Day    Types: Cigarettes   Smokeless tobacco: Never  Vaping Use   Vaping Use: Never used  Substance Use Topics   Alcohol use: Not Currently   Drug use: Never    Home Medications Prior to Admission medications   Medication Sig Start Date End Date Taking? Authorizing Provider  HYDROcodone-acetaminophen (NORCO) 5-325 MG tablet Take 1 tablet by mouth every 4 (four) hours as needed for severe pain. 03/17/21  Yes Pricilla Loveless, MD    Allergies    Patient has no known allergies.  Review of Systems   Review of Systems  Constitutional:  Negative for fever.  Gastrointestinal:  Positive for  abdominal pain, diarrhea and vomiting.  Musculoskeletal:  Positive for back pain.  All other systems reviewed and are negative.  Physical Exam Updated Vital Signs BP 119/83 (BP Location: Right Arm)    Pulse 90    Temp 98.3 F (36.8 C) (Oral)    Resp 20    Ht 5\' 7"  (1.702 m)    Wt 72.6 kg    SpO2 100%    BMI 25.06 kg/m   Physical Exam Vitals and nursing note reviewed.  Constitutional:      General: She is not in acute distress.    Appearance: She is well-developed. She is not ill-appearing or diaphoretic.  HENT:     Head: Normocephalic and atraumatic.     Right Ear: External ear normal.     Left Ear: External ear normal.     Nose: Nose normal.  Eyes:     General:        Right eye: No discharge.        Left eye: No discharge.  Cardiovascular:     Rate and Rhythm: Regular rhythm. Tachycardia present.     Heart sounds: Normal heart sounds.  Pulmonary:     Effort: Pulmonary effort is normal.     Breath sounds: Normal breath sounds.  Abdominal:     Palpations: Abdomen is  soft.     Tenderness: There is generalized abdominal tenderness (tender diffusely, though most prominent in LUQ) and tenderness in the left upper quadrant.  Skin:    General: Skin is warm and dry.  Neurological:     Mental Status: She is alert.  Psychiatric:        Mood and Affect: Mood is not anxious.    ED Results / Procedures / Treatments   Labs (all labs ordered are listed, but only abnormal results are displayed) Labs Reviewed  CBC WITH DIFFERENTIAL/PLATELET - Abnormal; Notable for the following components:      Result Value   WBC 13.2 (*)    Neutro Abs 10.4 (*)    All other components within normal limits  COMPREHENSIVE METABOLIC PANEL - Abnormal; Notable for the following components:   Glucose, Bld 114 (*)    AST 13 (*)    All other components within normal limits  LIPASE, BLOOD - Abnormal; Notable for the following components:   Lipase 90 (*)    All other components within normal limits   URINALYSIS, ROUTINE W REFLEX MICROSCOPIC - Abnormal; Notable for the following components:   Color, Urine AMBER (*)    APPearance CLOUDY (*)    Hgb urine dipstick MODERATE (*)    Ketones, ur 20 (*)    Protein, ur 30 (*)    Nitrite POSITIVE (*)    Leukocytes,Ua LARGE (*)    WBC, UA >50 (*)    Bacteria, UA MANY (*)    All other components within normal limits  AMYLASE - Abnormal; Notable for the following components:   Amylase 208 (*)    All other components within normal limits  PREGNANCY, URINE    EKG None  Radiology CT ABDOMEN PELVIS W CONTRAST  Result Date: 03/17/2021 CLINICAL DATA:  Abdominal pain.  Abnormal mL lace. EXAM: CT ABDOMEN AND PELVIS WITH CONTRAST TECHNIQUE: Multidetector CT imaging of the abdomen and pelvis was performed using the standard protocol following bolus administration of intravenous contrast. CONTRAST:  54mL OMNIPAQUE IOHEXOL 350 MG/ML SOLN COMPARISON:  None. FINDINGS: Lower chest: The lung bases are clear without focal nodule, mass, or airspace disease. The heart size is normal. No significant pleural or pericardial effusion is present. Hepatobiliary: Mild fatty infiltration of the liver is present. Patient is status post cholecystectomy. Common bile duct is within normal limits. Pancreas: Minimal stranding is present along the inferior margin of the pancreas near the tail. Normal enhancement is present. No significant cysts are present. Spleen: Normal in size without focal abnormality. Adrenals/Urinary Tract: Adrenal glands are unremarkable. Kidneys are normal, without renal calculi, focal lesion, or hydronephrosis. Bladder is unremarkable. Stomach/Bowel: The stomach is within normal limits. Duodenal diverticulum is present without associated inflammation. Duodenum is otherwise within normal limits. Small bowel is within normal limits. The terminal ileum is within normal limits. Appendix is visualized and normal. The ascending and transverse colon are within  normal limits. Wall thickening and inflammatory changes are present in the proximal descending colon. There is some wall thickening in the sigmoid colon without associated pericolonic inflammation. Vascular/Lymphatic: No significant vascular findings are present. No enlarged abdominal or pelvic lymph nodes. Reproductive: Uterus and bilateral adnexa are unremarkable. Other: Small amount of free fluid is noted along the left pericolic gutter and anatomic pelvis. No enhancing collection or evidence for abscess. Musculoskeletal: Vertebral body heights and alignment are normal. Chronic pelvis normal. No focal lesions are present. IMPRESSION: 1. Inflammatory changes about the distal pancreas compatible with acute pancreatitis.  No complicating factors. 2. Wall thickening and inflammatory changes in the proximal descending colon is likely reactive. 3. Small amount of free fluid along the left pericolic gutter and anatomic pelvis without evidence for abscess. 4. Mild fatty infiltration of the liver. 5. Cholecystectomy. Electronically Signed   By: Marin Roberts M.D.   On: 03/17/2021 15:58    Procedures Procedures   Medications Ordered in ED Medications  iohexol (OMNIPAQUE) 350 MG/ML injection 80 mL (80 mLs Intravenous Contrast Given 03/17/21 1546)  lactated ringers bolus 1,000 mL (1,000 mLs Intravenous New Bag/Given 03/17/21 1824)  lactated ringers bolus 1,000 mL (1,000 mLs Intravenous New Bag/Given 03/17/21 1823)  HYDROmorphone (DILAUDID) injection 1 mg (1 mg Intravenous Given 03/17/21 1824)  HYDROcodone-acetaminophen (NORCO/VICODIN) 5-325 MG per tablet 1 tablet (1 tablet Oral Given 03/17/21 2159)    ED Course  I have reviewed the triage vital signs and the nursing notes.  Pertinent labs & imaging results that were available during my care of the patient were reviewed by me and considered in my medical decision making (see chart for details).    MDM Rules/Calculators/A&P                          Patient initially was pretty tachycardic though she states she was also anxious because someone told her she would need to have her pancreas surgically removed.  Now, she is feeling much better.  At first she did not want any pain meds but then did ask for some after pain got worse.  She has tolerated oral meds well.  She has a mild leukocytosis and mild lipase elevation.  CT is consistent with pancreatitis without obvious acute cause.  At this point, after discussion with patient she feels well enough to go home which I think is reasonable but does not have a PCP so will need outpatient work-up for pancreatitis.  Will refer to gastroenterology.  She otherwise was given return precautions will be given a short course of Norco at home.  Instructed to do liquid diet until improved from an abdominal pain perspective.    Final Clinical Impression(s) / ED Diagnoses Final diagnoses:  Acute pancreatitis without infection or necrosis, unspecified pancreatitis type    Rx / DC Orders ED Discharge Orders          Ordered    Ambulatory referral to Gastroenterology        03/17/21 2047    HYDROcodone-acetaminophen (NORCO) 5-325 MG tablet  Every 4 hours PRN        03/17/21 2119             Pricilla Loveless, MD 03/17/21 2230

## 2021-03-17 NOTE — ED Provider Notes (Signed)
Emergency Medicine Provider Triage Evaluation Note  Deanna Olsen , a 40 y.o. female  was evaluated in triage.  Pt complains of gradual onset, 9/10, epigastric pain onset 2 days.  Patient was evaluated at her primary care office who obtained labs and had an elevated amylase at 664.  Patient denies NSAID, alcohol use, diabetes.  Has associated resolved nonbloody vomiting.  Has not tried medications for her symptoms denies fever, chills, chest pain, shortness of breath.    Review of Systems  Positive: Epigastric abdominal pain, resolved vomiting Negative: Fever, chills  Physical Exam  BP 104/76 (BP Location: Left Arm)    Pulse (!) 124    Temp 98.1 F (36.7 C) (Oral)    Resp 20    Ht 5\' 7"  (1.702 m)    Wt 72.6 kg    SpO2 100%    BMI 25.06 kg/m  Gen:   Awake, no distress   Resp:  Normal effort  MSK:   Moves extremities without difficulty  Other:  Tenderness to palpation to upper abdomen.  No spinal tenderness to palpation.  No overlying skin changes to back or abdomen.  Medical Decision Making  Medically screening exam initiated at 2:41 PM.  Appropriate orders placed.  Carrie Finn Altemose was informed that the remainder of the evaluation will be completed by another provider, this initial triage assessment does not replace that evaluation, and the importance of remaining in the ED until their evaluation is complete.  2:49 PM - Discussed with RN that patient is in need of a room immediately. RN aware and working on room placement.     Paytan Recine A, PA-C 03/17/21 1450    03/19/21, MD 03/17/21 1606

## 2021-06-28 ENCOUNTER — Encounter: Payer: Self-pay | Admitting: Emergency Medicine

## 2021-07-04 ENCOUNTER — Encounter: Payer: Self-pay | Admitting: Gastroenterology

## 2021-07-27 ENCOUNTER — Other Ambulatory Visit (INDEPENDENT_AMBULATORY_CARE_PROVIDER_SITE_OTHER): Payer: Self-pay

## 2021-07-27 ENCOUNTER — Ambulatory Visit (INDEPENDENT_AMBULATORY_CARE_PROVIDER_SITE_OTHER): Payer: Self-pay | Admitting: Gastroenterology

## 2021-07-27 ENCOUNTER — Encounter: Payer: Self-pay | Admitting: Gastroenterology

## 2021-07-27 VITALS — BP 126/74 | HR 80 | Ht 67.0 in | Wt 165.0 lb

## 2021-07-27 DIAGNOSIS — R9389 Abnormal findings on diagnostic imaging of other specified body structures: Secondary | ICD-10-CM

## 2021-07-27 DIAGNOSIS — K858 Other acute pancreatitis without necrosis or infection: Secondary | ICD-10-CM

## 2021-07-27 LAB — CBC WITH DIFFERENTIAL/PLATELET
Basophils Absolute: 0.1 10*3/uL (ref 0.0–0.1)
Basophils Relative: 0.8 % (ref 0.0–3.0)
Eosinophils Absolute: 0.1 10*3/uL (ref 0.0–0.7)
Eosinophils Relative: 1.3 % (ref 0.0–5.0)
HCT: 41.7 % (ref 36.0–46.0)
Hemoglobin: 14 g/dL (ref 12.0–15.0)
Lymphocytes Relative: 39.2 % (ref 12.0–46.0)
Lymphs Abs: 2.7 10*3/uL (ref 0.7–4.0)
MCHC: 33.6 g/dL (ref 30.0–36.0)
MCV: 90.2 fl (ref 78.0–100.0)
Monocytes Absolute: 0.4 10*3/uL (ref 0.1–1.0)
Monocytes Relative: 5.6 % (ref 3.0–12.0)
Neutro Abs: 3.6 10*3/uL (ref 1.4–7.7)
Neutrophils Relative %: 53.1 % (ref 43.0–77.0)
Platelets: 191 10*3/uL (ref 150.0–400.0)
RBC: 4.62 Mil/uL (ref 3.87–5.11)
RDW: 13.2 % (ref 11.5–15.5)
WBC: 6.8 10*3/uL (ref 4.0–10.5)

## 2021-07-27 LAB — COMPREHENSIVE METABOLIC PANEL
ALT: 8 U/L (ref 0–35)
AST: 10 U/L (ref 0–37)
Albumin: 4.4 g/dL (ref 3.5–5.2)
Alkaline Phosphatase: 63 U/L (ref 39–117)
BUN: 8 mg/dL (ref 6–23)
CO2: 26 mEq/L (ref 19–32)
Calcium: 9.4 mg/dL (ref 8.4–10.5)
Chloride: 105 mEq/L (ref 96–112)
Creatinine, Ser: 0.71 mg/dL (ref 0.40–1.20)
GFR: 106.64 mL/min (ref >60.00)
Glucose, Bld: 94 mg/dL (ref 70–99)
Potassium: 4.9 mEq/L (ref 3.5–5.1)
Sodium: 139 mEq/L (ref 135–145)
Total Bilirubin: 0.4 mg/dL (ref 0.2–1.2)
Total Protein: 7.3 g/dL (ref 6.0–8.3)

## 2021-07-27 LAB — LIPASE: Lipase: 23 U/L (ref 11.0–59.0)

## 2021-07-27 NOTE — Patient Instructions (Signed)
Your provider has requested that you go to the basement level for lab work before leaving today. Press "B" on the elevator. The lab is located at the first door on the left as you exit the elevator.  ? ?If you are age 40 or older, your body mass index should be between 23-30. Your Body mass index is 25.84 kg/m?Marland Kitchen If this is out of the aforementioned range listed, please consider follow up with your Primary Care Provider. ? ?If you are age 43 or younger, your body mass index should be between 19-25. Your Body mass index is 25.84 kg/m?Marland Kitchen If this is out of the aformentioned range listed, please consider follow up with your Primary Care Provider.  ? ?________________________________________________________ ? ?The [facility] providers would like to encourage you to use [facility] to communicate with providers for non-urgent requests or questions.  Due to long hold times on the telephone, sending your provider a message by [facility] may be a faster and more efficient way to get a response.  Please allow 48 business hours for a response.  Please remember that this is for non-urgent requests.  ?_______________________________________________________  ? ?Due to recent changes in healthcare laws, you may see the results of your imaging and laboratory studies on MyChart before your provider has had a chance to review them.  We understand that in some cases there may be results that are confusing or concerning to you. Not all laboratory results come back in the same time frame and the provider may be waiting for multiple results in order to interpret others.  Please give [place] 48 hours in order for your provider to thoroughly review all the results before contacting the office for clarification of your results.   ? ?I appreciate the  opportunity to care for you ? ?Thank You  ? ?Scott Cunningham,MD ?

## 2021-07-27 NOTE — Progress Notes (Signed)
? ?HPI : Deanna Olsen is a very pleasant 41 year old female with a history of multiple sclerosis and chronic tobacco use who is referred to [place] by Dr. Sherlie Ban for further evaluation and management of episodic abdominal pain and suspected pancreatitis. ?The patient experienced an episode of intense abdominal pain and December, prompting her to go to the emergency department.  The pain was severe (10 out of 10), located in the epigastrium and radiating to the back.  It was constant, and lasted about 5 days total.  During this time, she had very little appetite, and subsisting on liquid diet.  She denied nausea or vomiting.  No fevers or chills.  She had normal bowel movements during this time. ?In the emergency department she underwent a CT scan which showed mild inflammatory changes around the tail of the pancreas, as well as inflammatory changes in the sigmoid and descending colon.  There is no evidence of diverticulosis.  Her lipase at that time was mildly elevated at 90, but amylase was significantly elevated at 689.  She had a mildly elevated white blood cell count at 13,000.  Liver enzymes were normal.  She was not felt to require admission, and was discharged from the emergency department with pain medications and a referral to GI. ?Following this episode, she recovered, and felt well and back to her baseline until mid Cornella when she had another episode.  This episode was similar to the one in December, but she did not seek medical attention.  The pain was again located in the epigastrium/supraumbilical region.  It lasted about 4 days.  She took ibuprofen and Tylenol and went on a liquid diet.  Again, she had no nausea/vomiting, no fevers or chills and her bowel movements were normal.  She is completely recovered from that incident and now is back to her baseline. ? ?She denies any history of heavy alcohol use.  She used to drink occasionally (once a month), but she has not drank any alcohol since the  episode in December. ?She had her gallbladder removed in 2013. ?She is a heavy smoker, formerly 2 packs/day.  She has been trying to reduce her smoking, and is now down to 1/2 to 1 pack/day. ?She has no family history of pancreatitis or pancreatic cancer. ?She sometimes takes a multivitamin, but otherwise does not take any medications.  She has multiple sclerosis but has been off medications for about 7 years now, primarily because of lack of insurance and disease stability. ?Her weight has been stable. ? ?Past Medical History:  ?Diagnosis Date  ? Multiple sclerosis (HCC)   ? ? ? ?Past Surgical History:  ?Procedure Laterality Date  ? CHOLECYSTECTOMY    ? ?Family History  ?Problem Relation Age of Onset  ? Diabetes Mother   ? Heart disease Mother   ? Kidney disease Mother   ? Diabetes Father   ? Kidney disease Father   ? Diabetes Sister   ? Heart attack Maternal Grandmother   ? Heart attack Maternal Grandfather   ? Alzheimer's disease Paternal Grandmother   ? Dementia Paternal Grandfather   ? ?Social History  ? ?Tobacco Use  ? Smoking status: Every Day  ?  Types: Cigarettes  ? Smokeless tobacco: Never  ?Vaping Use  ? Vaping Use: Never used  ?Substance Use Topics  ? Alcohol use: Not Currently  ? Drug use: Never  ? ?Current Outpatient Medications  ?Medication Sig Dispense Refill  ? Multiple Vitamin (MULTIVITAMIN) tablet Take 1 tablet by mouth as  needed.    ? ?No current facility-administered medications for this visit.  ? ?No Known Allergies ? ? ?Review of Systems: ?All systems reviewed and negative except where noted in HPI.  ? ? ?No results found. ? ?Physical Exam: ?BP 126/74   Pulse 80   Ht 5\' 7"  (1.702 m)   Wt 165 lb (74.8 kg)   LMP 07/19/2021   BMI 25.84 kg/m?  ?Constitutional: Pleasant,well-developed, Caucasian female in no acute distress. ?HEENT: Normocephalic and atraumatic. Conjunctivae are normal. No scleral icterus. ?Neck supple.  ?Cardiovascular: Normal rate, regular rhythm.  ?Pulmonary/chest: Effort  normal and breath sounds normal. No wheezing, rales or rhonchi. ?Abdominal: Soft, nondistended, nontender. Bowel sounds active throughout. There are no masses palpable. No hepatomegaly. ?Extremities: no edema ?Neurological: Alert and oriented to person place and time. ?Skin: Skin is warm and dry. No rashes noted. ?Psychiatric: Normal mood and affect. Behavior is normal. ? ?CBC ?   ?Component Value Date/Time  ? WBC 13.2 (H) 03/17/2021 1452  ? RBC 4.66 03/17/2021 1452  ? HGB 13.9 03/17/2021 1452  ? HGB 14.4 03/16/2021 1615  ? HCT 41.0 03/17/2021 1452  ? HCT 41.8 03/16/2021 1615  ? PLT 220 03/17/2021 1452  ? PLT 219 03/16/2021 1615  ? MCV 88.0 03/17/2021 1452  ? MCV 87 03/16/2021 1615  ? MCH 29.8 03/17/2021 1452  ? MCHC 33.9 03/17/2021 1452  ? RDW 12.7 03/17/2021 1452  ? RDW 12.6 03/16/2021 1615  ? LYMPHSABS 1.9 03/17/2021 1452  ? LYMPHSABS 2.5 03/16/2021 1615  ? MONOABS 0.8 03/17/2021 1452  ? EOSABS 0.1 03/17/2021 1452  ? EOSABS 0.1 03/16/2021 1615  ? BASOSABS 0.0 03/17/2021 1452  ? BASOSABS 0.0 03/16/2021 1615  ? ? ?CMP  ?   ?Component Value Date/Time  ? NA 137 03/17/2021 1452  ? NA 137 03/16/2021 1615  ? K 3.7 03/17/2021 1452  ? CL 105 03/17/2021 1452  ? CO2 25 03/17/2021 1452  ? GLUCOSE 114 (H) 03/17/2021 1452  ? BUN 7 03/17/2021 1452  ? BUN 7 03/16/2021 1615  ? CREATININE 0.71 03/17/2021 1452  ? CALCIUM 8.9 03/17/2021 1452  ? PROT 6.8 03/17/2021 1452  ? PROT 6.6 03/16/2021 1615  ? ALBUMIN 3.8 03/17/2021 1452  ? ALBUMIN 4.3 03/16/2021 1615  ? AST 13 (L) 03/17/2021 1452  ? ALT 10 03/17/2021 1452  ? ALKPHOS 57 03/17/2021 1452  ? BILITOT 0.5 03/17/2021 1452  ? BILITOT 0.4 03/16/2021 1615  ? GFRNONAA >60 03/17/2021 1452  ? ?Component Ref Range & Units 4 mo ago ?(03/17/21) 4 mo ago ?(03/16/21)  ?Amylase 28 - 100 U/L 208 High   689 High Panic  R  ? ?Component Ref Range & Units 4 mo ago  ?Lipase 11 - 51 U/L 90 High   ? ?Narrative & Impression ?CLINICAL DATA:  Abdominal pain.  Abnormal mL lace. ?  ?EXAM: ?CT ABDOMEN AND  PELVIS WITH CONTRAST ?  ?TECHNIQUE: ?Multidetector CT imaging of the abdomen and pelvis was performed ?using the standard protocol following bolus administration of ?intravenous contrast. ?  ?CONTRAST:  66mL OMNIPAQUE IOHEXOL 350 MG/ML SOLN ?  ?COMPARISON:  None. ?  ?FINDINGS: ?Lower chest: The lung bases are clear without focal nodule, mass, or ?airspace disease. The heart size is normal. No significant pleural ?or pericardial effusion is present. ?  ?Hepatobiliary: Mild fatty infiltration of the liver is present. ?Patient is status post cholecystectomy. Common bile duct is within ?normal limits. ?  ?Pancreas: Minimal stranding is present along the inferior margin of ?the  pancreas near the tail. Normal enhancement is present. No ?significant cysts are present. ?  ?Spleen: Normal in size without focal abnormality. ?  ?Adrenals/Urinary Tract: Adrenal glands are unremarkable. Kidneys are ?normal, without renal calculi, focal lesion, or hydronephrosis. ?Bladder is unremarkable. ?  ?Stomach/Bowel: The stomach is within normal limits. Duodenal ?diverticulum is present without associated inflammation. Duodenum is ?otherwise within normal limits. Small bowel is within normal limits. ?The terminal ileum is within normal limits. Appendix is visualized ?and normal. The ascending and transverse colon are within normal ?limits. Wall thickening and inflammatory changes are present in the ?proximal descending colon. There is some wall thickening in the ?sigmoid colon without associated pericolonic inflammation. ?  ?Vascular/Lymphatic: No significant vascular findings are present. No ?enlarged abdominal or pelvic lymph nodes. ?  ?Reproductive: Uterus and bilateral adnexa are unremarkable. ?  ?Other: Small amount of free fluid is noted along the left pericolic ?gutter and anatomic pelvis. No enhancing collection or evidence for ?abscess. ?  ?Musculoskeletal: Vertebral body heights and alignment are normal. ?Chronic pelvis normal.  No focal lesions are present. ?  ?IMPRESSION: ?1. Inflammatory changes about the distal pancreas compatible with ?acute pancreatitis. No complicating factors. ?2. Wall thickening and inflammatory changes in th

## 2021-08-02 NOTE — Progress Notes (Signed)
Deanna Olsen,  ?Your labs were all normal.  Please continue to avoid alcohol completely and focus on smoking cessation.  If your abdominal pain returns, it will be important to get a CT scan and labs to assess if you are truly having recurrent pancreatitis.

## 2022-05-19 ENCOUNTER — Other Ambulatory Visit: Payer: Self-pay | Admitting: Neurology

## 2022-05-19 DIAGNOSIS — G35 Multiple sclerosis: Secondary | ICD-10-CM

## 2024-04-30 ENCOUNTER — Ambulatory Visit: Payer: Self-pay
